# Patient Record
Sex: Female | Born: 2000
Health system: Southern US, Community
[De-identification: ages and names within clinical notes are randomized; demographics above are authoritative.]

---
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
## 2021-03-06 ENCOUNTER — Ambulatory Visit (INDEPENDENT_AMBULATORY_CARE_PROVIDER_SITE_OTHER): Payer: BC Managed Care – PPO | Admitting: Nurse Practitioner

## 2021-03-06 ENCOUNTER — Encounter: Payer: Self-pay | Admitting: Nurse Practitioner

## 2021-03-06 ENCOUNTER — Other Ambulatory Visit: Payer: Self-pay

## 2021-03-06 VITALS — BP 122/72 | HR 51 | Temp 97.6°F | Ht 68.5 in | Wt 178.0 lb

## 2021-03-06 DIAGNOSIS — Z6826 Body mass index (BMI) 26.0-26.9, adult: Secondary | ICD-10-CM | POA: Diagnosis not present

## 2021-03-06 DIAGNOSIS — Z0000 Encounter for general adult medical examination without abnormal findings: Secondary | ICD-10-CM

## 2021-03-06 NOTE — Patient Instructions (Addendum)
We will call you with lab results and when rabies vaccines arrive Follow-up 1-year    Health Maintenance, Female Adopting a healthy lifestyle and getting preventive care are important in promoting health and wellness. Ask your health care provider about: The right schedule for you to have regular tests and exams. Things you can do on your own to prevent diseases and keep yourself healthy. What should I know about diet, weight, and exercise? Eat a healthy diet  Eat a diet that includes plenty of vegetables, fruits, low-fat dairy products, and lean protein. Do not eat a lot of foods that are high in solid fats, added sugars, or sodium.  Maintain a healthy weight Body mass index (BMI) is used to identify weight problems. It estimates body fat based on height and weight. Your health care provider can help determineyour BMI and help you achieve or maintain a healthy weight. Get regular exercise Get regular exercise. This is one of the most important things you can do for your health. Most adults should: Exercise for at least 150 minutes each week. The exercise should increase your heart rate and make you sweat (moderate-intensity exercise). Do strengthening exercises at least twice a week. This is in addition to the moderate-intensity exercise. Spend less time sitting. Even light physical activity can be beneficial. Watch cholesterol and blood lipids Have your blood tested for lipids and cholesterol at 19 years of age, then havethis test every 5 years. Have your cholesterol levels checked more often if: Your lipid or cholesterol levels are high. You are older than 19 years of age. You are at high risk for heart disease. What should I know about cancer screening? Depending on your health history and family history, you may need to have cancer screening at various ages. This may include screening for: Breast cancer. Cervical cancer. Colorectal cancer. Skin cancer. Lung cancer. What should  I know about heart disease, diabetes, and high blood pressure? Blood pressure and heart disease High blood pressure causes heart disease and increases the risk of stroke. This is more likely to develop in people who have high blood pressure readings, are of African descent, or are overweight. Have your blood pressure checked: Every 3-5 years if you are 19-65 years of age. Every year if you are 19 years old or older. Diabetes Have regular diabetes screenings. This checks your fasting blood sugar level. Have the screening done: Once every three years after age 19 if you are at a normal weight and have a low risk for diabetes. More often and at a younger age if you are overweight or have a high risk for diabetes. What should I know about preventing infection? Hepatitis B If you have a higher risk for hepatitis B, you should be screened for this virus. Talk with your health care provider to find out if you are at risk forhepatitis B infection. Hepatitis C Testing is recommended for: Everyone born from [birth year] through 1965. Anyone with known risk factors for hepatitis C. Sexually transmitted infections (STIs) Get screened for STIs, including gonorrhea and chlamydia, if: You are sexually active and are younger than 19 years of age. You are older than 19 years of age and your health care provider tells you that you are at risk for this type of infection. Your sexual activity has changed since you were last screened, and you are at increased risk for chlamydia or gonorrhea. Ask your health care provider if you are at risk. Ask your health care provider about whether you  are at high risk for HIV. Your health care provider may recommend a prescription medicine to help prevent HIV infection. If you choose to take medicine to prevent HIV, you should first get tested for HIV. You should then be tested every 3 months for as long as you are taking the medicine. Pregnancy If you are about to stop having your  period (premenopausal) and you may become pregnant, seek counseling before you get pregnant. Take 400 to 800 micrograms (mcg) of folic acid every day if you become pregnant. Ask for birth control (contraception) if you want to prevent pregnancy. Osteoporosis and menopause Osteoporosis is a disease in which the bones lose minerals and strength with aging. This can result in bone fractures. If you are 16 years old or older, or if you are at risk for osteoporosis and fractures, ask your health care provider if you should: Be screened for bone loss. Take a calcium or vitamin D supplement to lower your risk of fractures. Be given hormone replacement therapy (HRT) to treat symptoms of menopause. Follow these instructions at home: Lifestyle Do not use any products that contain nicotine or tobacco, such as cigarettes, e-cigarettes, and chewing tobacco. If you need help quitting, ask your health care provider. Do not use street drugs. Do not share needles. Ask your health care provider for help if you need support or information about quitting drugs. Alcohol use Do not drink alcohol if: Your health care provider tells you not to drink. You are pregnant, may be pregnant, or are planning to become pregnant. If you drink alcohol: Limit how much you use to 0-1 drink a day. Limit intake if you are breastfeeding. Be aware of how much alcohol is in your drink. In the [place] one drink equals one 12 oz bottle of beer (355 mL), one 5 oz glass of wine (148 mL), or one 1 oz glass of hard liquor (44 mL). General instructions Schedule regular health, dental, and eye exams. Stay current with your vaccines. Tell your health care provider if: You often feel depressed. You have ever been abused or do not feel safe at home. Summary Adopting a healthy lifestyle and getting preventive care are important in promoting health and wellness. Follow your health care provider's instructions about healthy diet, exercising,  and getting tested or screened for diseases. Follow your health care provider's instructions on monitoring your cholesterol and blood pressure. This information is not intended to replace advice given to you by your health care provider. Make sure you discuss any questions you have with your healthcare provider. Document Revised: 08/03/2018 Document Reviewed: 08/03/2018 Elsevier Patient Education  2022 [employer].

## 2021-03-06 NOTE — Progress Notes (Signed)
New Patient Office Visit  Subjective:  Patient ID: Laura Luna, female    DOB: 28-Aug-2000  Age: 20 y.o. MRN: [id number]  CC:  Chief Complaint  Patient presents with   Annual Exam    College physical    HPI Laura Luna is an 20 year old Caucasian female that presents for college physical and to establish a primary care provider. This is her initial visit to the office. 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 She has a past medical history of chronic allergic rhinitis that are worse in the spring. She exercises regularly and is an avid Ship broker. She consumes a heart healthy diet. She wears contact lenses/prescription glasses, sees eye doctor yearly. Laura Luna is up-to-date on routine dental exams.   History reviewed. No pertinent past medical history.  History reviewed. No pertinent surgical history.  Family History  Problem Relation Age of Onset   Diabetes Father     Social History   Socioeconomic History   Marital status: Single    Spouse name: Not on file   Number of children: Not on file   Years of education: Not on file   Highest education level: Not on file  Occupational History   Not on file  Tobacco Use   Smoking status: Never    Passive exposure: Never   Smokeless tobacco: Never  Vaping Use   Vaping Use: Never used  Substance and Sexual Activity   Alcohol use: Never   Drug use: Never   Sexual activity: Not on file  Other Topics Concern   Not on file  Social History Narrative   Not on file   Social Determinants of Health   Financial Resource Strain: Not on file  Food Insecurity: Not on file  Transportation Needs: Not on file  Physical Activity: Not on file  Stress: Not on file  Social Connections: Not on file  Intimate Partner Violence: Not on file    ROS Review of Systems  Constitutional:  Negative for appetite change, fatigue and fever.   HENT:  Negative for congestion, ear pain, sinus pressure and sore throat.   Eyes:  Negative for pain.  Respiratory:  Negative for cough, chest tightness, shortness of breath and wheezing.   Cardiovascular:  Negative for chest pain and palpitations.  Gastrointestinal:  Negative for abdominal pain, constipation, diarrhea, nausea and vomiting.  Genitourinary:  Negative for dysuria and hematuria.  Musculoskeletal:  Negative for arthralgias, back pain, joint swelling and myalgias.  Skin:  Negative for rash.  Neurological:  Negative for dizziness, weakness and headaches.  Psychiatric/Behavioral:  Negative for dysphoric mood. The patient is not nervous/anxious.    Objective:   Today's Vitals: BP 122/72 (BP Location: Left Arm, Patient Position: Sitting)   Pulse (!) 51   Temp 97.6 F (36.4 C) (Temporal)   Ht 5' 8.5" (1.74 m)   Wt 178 lb (80.7 kg)   SpO2 99%   BMI 26.67 kg/m   Physical Exam Vitals reviewed.  Constitutional:      Appearance: Normal appearance. She is normal weight.  HENT:     Right Ear: Tympanic membrane, ear canal and external ear normal.     Left Ear: Tympanic membrane, ear canal and external ear normal.     Nose: Nose normal.     Mouth/Throat:     Mouth: Mucous membranes are moist.  Cardiovascular:     Rate and Rhythm: Normal rate and  regular rhythm.     Pulses: Normal pulses.     Heart sounds: Normal heart sounds.  Pulmonary:     Effort: Pulmonary effort is normal.     Breath sounds: Normal breath sounds.  Abdominal:     Palpations: Abdomen is soft.  Musculoskeletal:        General: Normal range of motion.     Cervical back: Normal range of motion.  Skin:    General: Skin is warm and dry.  Neurological:     Mental Status: She is alert and oriented to person, place, and time.  Psychiatric:        Mood and Affect: Mood normal.        Behavior: Behavior normal.        Thought Content: Thought content normal.        Judgment: Judgment normal.     Assessment & Plan:   1. Encounter for general adult medical examination without abnormal findings - QuantiFERON-TB Gold Plus - CBC with Differential/Platelet - Comprehensive metabolic panel - TSH -Laura Luna will return for rabies vaccine series and seasonal flu shot when they are available  2. BMI 26.0-26.9,adult  3. Encounter for medical examination to establish care   We will call you with lab results and when rabies vaccines arrive Follow-up 1-year   Outpatient Encounter Medications as of 03/06/2021  Medication Sig   EPINEPHrine 0.3 mg/0.3 mL IJ SOAJ injection Use as directed for life threatening allergic reaction. May use Mylan generic   No facility-administered encounter medications on file as of 03/06/2021.    Follow-up: Return in about 1 year (around 03/06/2022).     I,Lauren M Auman,acting as a Neurosurgeon for [employer], NP.,have documented all relevant documentation on the behalf of Janie Morning, NP,as directed by  Janie Morning, NP while in the presence of Janie Morning, NP.   I, Janie Morning, NP, have reviewed all documentation for this visit. The documentation on 03/06/21 for the exam, diagnosis, procedures, and orders are all accurate and complete.   Signed, Flonnie Hailstone, DNP

## 2021-03-11 LAB — COMPREHENSIVE METABOLIC PANEL
ALT: 17 IU/L (ref 0–32)
AST: 19 IU/L (ref 0–40)
Albumin/Globulin Ratio: 1.8 (ref 1.2–2.2)
Albumin: 4.6 g/dL (ref 3.9–5.0)
Alkaline Phosphatase: 68 IU/L (ref 42–106)
BUN/Creatinine Ratio: 22 (ref 9–23)
BUN: 21 mg/dL — ABNORMAL HIGH (ref 6–20)
Bilirubin Total: 1.2 mg/dL (ref 0.0–1.2)
CO2: 23 mmol/L (ref 20–29)
Calcium: 9.8 mg/dL (ref 8.7–10.2)
Chloride: 99 mmol/L (ref 96–106)
Creatinine, Ser: 0.95 mg/dL (ref 0.57–1.00)
Globulin, Total: 2.5 g/dL (ref 1.5–4.5)
Glucose: 79 mg/dL (ref 65–99)
Potassium: 4.5 mmol/L (ref 3.5–5.2)
Sodium: 135 mmol/L (ref 134–144)
Total Protein: 7.1 g/dL (ref 6.0–8.5)
eGFR: 89 mL/min/{1.73_m2} (ref >59)

## 2021-03-11 LAB — CBC WITH DIFFERENTIAL/PLATELET
Basophils Absolute: 0.1 10*3/uL (ref 0.0–0.2)
Basos: 1 %
EOS (ABSOLUTE): 0.1 10*3/uL (ref 0.0–0.4)
Eos: 1 %
Hematocrit: 41.1 % (ref 34.0–46.6)
Hemoglobin: 13.4 g/dL (ref 11.1–15.9)
Immature Grans (Abs): 0 10*3/uL (ref 0.0–0.1)
Immature Granulocytes: 0 %
Lymphocytes Absolute: 1.5 10*3/uL (ref 0.7–3.1)
Lymphs: 15 %
MCH: 29.2 pg (ref 26.6–33.0)
MCHC: 32.6 g/dL (ref 31.5–35.7)
MCV: 90 fL (ref 79–97)
Monocytes Absolute: 0.5 10*3/uL (ref 0.1–0.9)
Monocytes: 5 %
Neutrophils Absolute: 8 10*3/uL — ABNORMAL HIGH (ref 1.4–7.0)
Neutrophils: 78 %
Platelets: 267 10*3/uL (ref 150–450)
RBC: 4.59 x10E6/uL (ref 3.77–5.28)
RDW: 11.9 % (ref 11.7–15.4)
WBC: 10.2 10*3/uL (ref 3.4–10.8)

## 2021-03-11 LAB — QUANTIFERON-TB GOLD PLUS
QuantiFERON Mitogen Value: >10 IU/mL
QuantiFERON Nil Value: 0.02 IU/mL
QuantiFERON TB1 Ag Value: 0.03 IU/mL
QuantiFERON TB2 Ag Value: 0.03 IU/mL
QuantiFERON-TB Gold Plus: NEGATIVE

## 2021-03-11 LAB — TSH: TSH: 2.32 u[IU]/mL (ref 0.450–4.500)

## 2021-03-26 ENCOUNTER — Encounter: Payer: Self-pay | Admitting: Nurse Practitioner

## 2021-03-31 ENCOUNTER — Ambulatory Visit (INDEPENDENT_AMBULATORY_CARE_PROVIDER_SITE_OTHER): Payer: BC Managed Care – PPO

## 2021-03-31 ENCOUNTER — Other Ambulatory Visit: Payer: Self-pay

## 2021-03-31 DIAGNOSIS — Z23 Encounter for immunization: Secondary | ICD-10-CM

## 2021-04-08 ENCOUNTER — Other Ambulatory Visit: Payer: Self-pay

## 2021-04-08 ENCOUNTER — Ambulatory Visit (INDEPENDENT_AMBULATORY_CARE_PROVIDER_SITE_OTHER): Payer: BC Managed Care – PPO

## 2021-04-08 DIAGNOSIS — Z23 Encounter for immunization: Secondary | ICD-10-CM

## 2021-04-30 ENCOUNTER — Encounter: Payer: Self-pay | Admitting: Nurse Practitioner

## 2021-05-01 ENCOUNTER — Ambulatory Visit: Payer: BC Managed Care – PPO

## 2021-05-14 DIAGNOSIS — M542 Cervicalgia: Secondary | ICD-10-CM | POA: Diagnosis not present

## 2021-05-14 DIAGNOSIS — M9903 Segmental and somatic dysfunction of lumbar region: Secondary | ICD-10-CM | POA: Diagnosis not present

## 2021-05-14 DIAGNOSIS — M9902 Segmental and somatic dysfunction of thoracic region: Secondary | ICD-10-CM | POA: Diagnosis not present

## 2021-05-14 DIAGNOSIS — M9901 Segmental and somatic dysfunction of cervical region: Secondary | ICD-10-CM | POA: Diagnosis not present

## 2021-05-19 DIAGNOSIS — M9903 Segmental and somatic dysfunction of lumbar region: Secondary | ICD-10-CM | POA: Diagnosis not present

## 2021-05-19 DIAGNOSIS — M542 Cervicalgia: Secondary | ICD-10-CM | POA: Diagnosis not present

## 2021-05-19 DIAGNOSIS — M9902 Segmental and somatic dysfunction of thoracic region: Secondary | ICD-10-CM | POA: Diagnosis not present

## 2021-05-19 DIAGNOSIS — M9901 Segmental and somatic dysfunction of cervical region: Secondary | ICD-10-CM | POA: Diagnosis not present

## 2021-09-19 DIAGNOSIS — L700 Acne vulgaris: Secondary | ICD-10-CM | POA: Diagnosis not present

## 2021-11-14 DIAGNOSIS — L700 Acne vulgaris: Secondary | ICD-10-CM | POA: Diagnosis not present

## 2021-12-09 DIAGNOSIS — Z20828 Contact with and (suspected) exposure to other viral communicable diseases: Secondary | ICD-10-CM | POA: Diagnosis not present

## 2021-12-09 DIAGNOSIS — R051 Acute cough: Secondary | ICD-10-CM | POA: Diagnosis not present

## 2021-12-09 DIAGNOSIS — M791 Myalgia, unspecified site: Secondary | ICD-10-CM | POA: Diagnosis not present

## 2021-12-09 DIAGNOSIS — J209 Acute bronchitis, unspecified: Secondary | ICD-10-CM | POA: Diagnosis not present

## 2021-12-09 DIAGNOSIS — J0100 Acute maxillary sinusitis, unspecified: Secondary | ICD-10-CM | POA: Diagnosis not present

## 2021-12-09 DIAGNOSIS — R0981 Nasal congestion: Secondary | ICD-10-CM | POA: Diagnosis not present

## 2022-01-19 DIAGNOSIS — N926 Irregular menstruation, unspecified: Secondary | ICD-10-CM | POA: Insufficient documentation

## 2022-02-13 DIAGNOSIS — L700 Acne vulgaris: Secondary | ICD-10-CM | POA: Diagnosis not present

## 2022-02-14 DIAGNOSIS — R112 Nausea with vomiting, unspecified: Secondary | ICD-10-CM | POA: Diagnosis not present

## 2022-02-14 DIAGNOSIS — H81399 Other peripheral vertigo, unspecified ear: Secondary | ICD-10-CM | POA: Diagnosis not present

## 2022-02-19 ENCOUNTER — Encounter: Payer: BC Managed Care – PPO | Admitting: Nurse Practitioner

## 2022-02-19 NOTE — Progress Notes (Signed)
Subjective:  Patient ID: Laura Luna, female    DOB: [birth year]  Age: 21 y.o. MRN: [id number]  Chief Complaint  Patient presents with   Annual Exam    HPI Encounter for general adult medical examination without abnormal findings  Physical ("At Risk" items are starred): Patient's last physical exam was 1 year ago .      SDOH Screenings   Alcohol Screen: Low Risk  (02/20/2022)   Alcohol Screen    Last Alcohol Screening Score (AUDIT): 0  Depression (PHQ2-9): Low Risk  (02/20/2022)   Depression (PHQ2-9)    PHQ-2 Score: 0  Financial Resource Strain: Low Risk  (02/20/2022)   Overall Financial Resource Strain (CARDIA)    Difficulty of Paying Living Expenses: Not hard at all  Food Insecurity: No Food Insecurity (02/20/2022)   Hunger Vital Sign    Worried About Running Out of Food in the Last Year: Never true    Ran Out of Food in the Last Year: Never true  Housing: Low Risk  (02/20/2022)   Housing    Last Housing Risk Score: 0  Physical Activity: Sufficiently Active (02/20/2022)   Exercise Vital Sign    Days of Exercise per Week: 5 days    Minutes of Exercise per Session: 60 min  Social Connections: Moderately Isolated (02/20/2022)   Social Connection and Isolation Panel [NHANES]    Frequency of Communication with Friends and Family: More than three times a week    Frequency of Social Gatherings with Friends and Family: More than three times a week    Attends Religious Services: More than 4 times per year    Active Member of Clubs or Organizations: No    Attends Banker Meetings: Never    Marital Status: Never married  Stress: No Stress Concern Present (02/20/2022)   [employer] of Occupational Health - Occupational Stress Questionnaire    Feeling of Stress : Not at all  Tobacco Use: Low Risk  (03/06/2021)   Patient History    Smoking Tobacco Use: Never    Smokeless Tobacco Use: Never    Passive Exposure: Never  Transportation Needs: No Transportation Needs  (02/20/2022)   PRAPARE - Transportation    Lack of Transportation (Medical): No    Lack of Transportation (Non-Medical): No       02/20/2022   11:12 AM 03/06/2021   10:16 AM  Fall Risk   Falls in the past year? 0 0  Number falls in past yr: 0 0  Injury with Fall? 0 0  Risk for fall due to : No Fall Risks   Follow up Falls evaluation completed Falls evaluation completed       02/20/2022   11:13 AM 03/06/2021   10:15 AM  Depression screen PHQ 2/9  Decreased Interest 0 0  Down, Depressed, Hopeless 0 0  PHQ - 2 Score 0 0    Functional Status Survey: Is the patient deaf or have difficulty hearing?: No Does the patient have difficulty seeing, even when wearing glasses/contacts?: No Does the patient have difficulty concentrating, remembering, or making decisions?: No Does the patient have difficulty walking or climbing stairs?: No Does the patient have difficulty dressing or bathing?: No Does the patient have difficulty doing errands alone such as visiting a doctor's office or shopping?: No   Safety: reviewed ;  Patient wears a seat belt. Patient's home has smoke detectors and carbon monoxide detectors. Patient practices appropriate gun safety Patient wears sunscreen with extended sun exposure. Dental  Care: biannual cleanings, brushes and flosses daily. Ophthalmology/Optometry: Overdue Hearing loss: none Vision impairments: wears contact lenses Patient is not afflicted from Stress Incontinence and Urge Incontinence   Current Outpatient Medications on File Prior to Visit  Medication Sig Dispense Refill   EPINEPHrine 0.3 mg/0.3 mL IJ SOAJ injection Use as directed for life threatening allergic reaction. May use Mylan generic     No current facility-administered medications on file prior to visit.    Social Hx   Social History   Socioeconomic History   Marital status: Single    Spouse name: Not on file   Number of children: Not on file   Years of education: Not on file    Highest education level: Not on file  Occupational History   Not on file  Tobacco Use   Smoking status: Never    Passive exposure: Never   Smokeless tobacco: Never  Vaping Use   Vaping Use: Never used  Substance and Sexual Activity   Alcohol use: Never   Drug use: Never   Sexual activity: Never  Other Topics Concern   Not on file  Social History Narrative   Not on file   Social Determinants of Health   Financial Resource Strain: Low Risk  (02/20/2022)   Overall Financial Resource Strain (CARDIA)    Difficulty of Paying Living Expenses: Not hard at all  Food Insecurity: No Food Insecurity (02/20/2022)   Hunger Vital Sign    Worried About Running Out of Food in the Last Year: Never true    Ran Out of Food in the Last Year: Never true  Transportation Needs: No Transportation Needs (02/20/2022)   PRAPARE - Administrator, Civil Service (Medical): No    Lack of Transportation (Non-Medical): No  Physical Activity: Sufficiently Active (02/20/2022)   Exercise Vital Sign    Days of Exercise per Week: 5 days    Minutes of Exercise per Session: 60 min  Stress: No Stress Concern Present (02/20/2022)   [employer] of Occupational Health - Occupational Stress Questionnaire    Feeling of Stress : Not at all  Social Connections: Moderately Isolated (02/20/2022)   Social Connection and Isolation Panel [NHANES]    Frequency of Communication with Friends and Family: More than three times a week    Frequency of Social Gatherings with Friends and Family: More than three times a week    Attends Religious Services: More than 4 times per year    Active Member of [employer] or Organizations: No    Attends Banker Meetings: Never    Marital Status: Never married   History reviewed. No pertinent past medical history. Family History  Problem Relation Age of Onset   Diabetes Father     Review of Systems  Constitutional:  Negative for chills, fatigue and fever.  HENT:   Negative for congestion, ear pain, rhinorrhea and sore throat.   Respiratory:  Negative for cough and shortness of breath.   Cardiovascular:  Negative for chest pain.  Gastrointestinal:  Negative for abdominal pain, constipation, diarrhea, nausea and vomiting.  Genitourinary:  Positive for menstrual problem (menorrhagia). Negative for dysuria and urgency.  Musculoskeletal:  Negative for back pain and myalgias.  Neurological:  Negative for dizziness, weakness, light-headedness and headaches.  Hematological:  Bruises/bleeds easily.  Psychiatric/Behavioral:  Negative for dysphoric mood. The patient is not nervous/anxious.      Objective:  BP 114/64   Pulse 84   Temp (!) 97.3 F (36.3 C)  Ht 5\' 9"  (1.753 m)   Wt 175 lb (79.4 kg)   LMP 02/10/2022 Comment: States she has had menstrual cycle 4 times since the beginning of May.  SpO2 100%   BMI 25.84 kg/m       02/20/2022   11:11 AM 03/06/2021   10:17 AM  BP/Weight  Systolic BP  122  Diastolic BP  72  Wt. (Lbs) 175 178  BMI 25.84 kg/m2 26.67 kg/m2    Physical Exam Vitals reviewed.  Constitutional:      Appearance: Normal appearance.  HENT:     Head: Normocephalic.     Right Ear: Tympanic membrane normal.     Left Ear: Tympanic membrane normal.     Nose: Nose normal.     Mouth/Throat:     Mouth: Mucous membranes are moist.  Eyes:     Pupils: Pupils are equal, round, and reactive to light.  Cardiovascular:     Rate and Rhythm: Normal rate and regular rhythm.  Pulmonary:     Effort: Pulmonary effort is normal.     Breath sounds: Normal breath sounds.  Abdominal:     General: Bowel sounds are normal.     Palpations: Abdomen is soft.  Musculoskeletal:        General: Normal range of motion.     Cervical back: Neck supple.  Skin:    General: Skin is warm and dry.     Capillary Refill: Capillary refill takes less than 2 seconds.  Neurological:     General: No focal deficit present.     Mental Status: She is alert and  oriented to person, place, and time.  Psychiatric:        Mood and Affect: Mood normal.        Behavior: Behavior normal.     Lab Results  Component Value Date   WBC 10.2 03/06/2021   HGB 13.4 03/06/2021   HCT 41.1 03/06/2021   PLT 267 03/06/2021   GLUCOSE 79 03/06/2021   ALT 17 03/06/2021   AST 19 03/06/2021   NA 135 03/06/2021   K 4.5 03/06/2021   CL 99 03/06/2021   CREATININE 0.95 03/06/2021   BUN 21 (H) 03/06/2021   CO2 23 03/06/2021   TSH 2.320 03/06/2021      Assessment & Plan:  1. Routine general medical examination at a health care facility - CBC with Differential/Platelet - Comprehensive metabolic panel - TSH  2. BMI 25.0-25.9,adult - CBC with Differential/Platelet - Comprehensive metabolic panel - TSH      These are the goals we discussed:  Goals   Continue heart healthy diet and regular physical activity      This is a list of the screening recommended for you and due dates:  Health Maintenance  Topic Date Due   Flu Shot  03/24/2022   Tetanus Vaccine  04/15/2023   HPV Vaccine  Completed      AN INDIVIDUALIZED CARE PLAN: was established or reinforced today.   SELF MANAGEMENT: The patient and I together assessed ways to personally work towards obtaining the recommended goals  Support needs The patient and/or family needs were assessed and services were offered and not necessary at this time.    Follow-up: 1-year, cpe  Signed, 04/17/2023, DNP [facility] [phone]

## 2022-02-20 ENCOUNTER — Ambulatory Visit (INDEPENDENT_AMBULATORY_CARE_PROVIDER_SITE_OTHER): Payer: BC Managed Care – PPO | Admitting: Nurse Practitioner

## 2022-02-20 ENCOUNTER — Other Ambulatory Visit: Payer: Self-pay | Admitting: Nurse Practitioner

## 2022-02-20 ENCOUNTER — Encounter: Payer: Self-pay | Admitting: Nurse Practitioner

## 2022-02-20 VITALS — BP 114/64 | HR 84 | Temp 97.3°F | Ht 69.0 in | Wt 175.0 lb

## 2022-02-20 DIAGNOSIS — N921 Excessive and frequent menstruation with irregular cycle: Secondary | ICD-10-CM

## 2022-02-20 DIAGNOSIS — Z6825 Body mass index (BMI) 25.0-25.9, adult: Secondary | ICD-10-CM | POA: Diagnosis not present

## 2022-02-20 DIAGNOSIS — Z0000 Encounter for general adult medical examination without abnormal findings: Secondary | ICD-10-CM | POA: Diagnosis not present

## 2022-02-20 MED ORDER — NORETHINDRONE 0.35 MG PO TABS: 1.0000 | ORAL_TABLET | Freq: Every day | ORAL | 11 refills | Status: DC

## 2022-02-20 NOTE — Patient Instructions (Addendum)
Preventive Care 20-20 Years Old, Female Preventive care refers to lifestyle choices and visits with your health care provider that can promote health and wellness. At this stage in your life, you may start seeing a primary care physician instead of a pediatrician for your preventive care. Preventive care visits are also called wellness exams. What can I expect for my preventive care visit? Counseling During your preventive care visit, your health care provider may ask about your: Medical history, including: Past medical problems. Family medical history. Pregnancy history. Current health, including: Menstrual cycle. Method of birth control. Emotional well-being. Home life and relationship well-being. Sexual activity and sexual health. Lifestyle, including: Alcohol, nicotine or tobacco, and drug use. Access to firearms. Diet, exercise, and sleep habits. Sunscreen use. Motor vehicle safety. Physical exam Your health care provider may check your: Height and weight. These may be used to calculate your BMI (body mass index). BMI is a measurement that tells if you are at a healthy weight. Waist circumference. This measures the distance around your waistline. This measurement also tells if you are at a healthy weight and may help predict your risk of certain diseases, such as type 2 diabetes and high blood pressure. Heart rate and blood pressure. Body temperature. Skin for abnormal spots. Breasts. What immunizations do I need?  Vaccines are usually given at various ages, according to a schedule. Your health care provider will recommend vaccines for you based on your age, medical history, and lifestyle or other factors, such as travel or where you work. What tests do I need? Screening Your health care provider may recommend screening tests for certain conditions. This may include: Vision and hearing tests. Lipid and cholesterol levels. Pelvic exam and Pap test. Hepatitis B  test. Hepatitis C test. HIV (human immunodeficiency virus) test. STI (sexually transmitted infection) testing, if you are at risk. Tuberculosis skin test if you have symptoms. BRCA-related cancer screening. This may be done if you have a family history of breast, ovarian, tubal, or peritoneal cancers. Talk with your health care provider about your test results, treatment options, and if necessary, the need for more tests. Follow these instructions at home: Eating and drinking  Eat a healthy diet that includes fresh fruits and vegetables, whole grains, lean protein, and low-fat dairy products. Drink enough fluid to keep your urine pale yellow. Do not drink alcohol if: Your health care provider tells you not to drink. You are pregnant, may be pregnant, or are planning to become pregnant. You are under the legal drinking age. In the [place] the legal drinking age is 67. If you drink alcohol: Limit how much you have to 0-1 drink a day. Know how much alcohol is in your drink. In the [place] one drink equals one 12 oz bottle of beer (355 mL), one 5 oz glass of wine (148 mL), or one 1 oz glass of hard liquor (44 mL). Lifestyle Brush your teeth every morning and night with fluoride toothpaste. Floss one time each day. Exercise for at least 30 minutes 5 or more days of the week. Do not use any products that contain nicotine or tobacco. These products include cigarettes, chewing tobacco, and vaping devices, such as e-cigarettes. If you need help quitting, ask your health care provider. Do not use drugs. If you are sexually active, practice safe sex. Use a condom or other form of protection to prevent STIs. If you do not wish to become pregnant, use a form of birth control. If you plan to become pregnant,  see your health care provider for a prepregnancy visit. Find healthy ways to manage stress, such as: Meditation, yoga, or listening to music. Journaling. Talking to a trusted person. Spending time  with friends and family. Safety Always wear your seat belt while driving or riding in a vehicle. Do not drive: If you have been drinking alcohol. Do not ride with someone who has been drinking. When you are tired or distracted. While texting. If you have been using any mind-altering substances or drugs. Wear a helmet and other protective equipment during sports activities. If you have firearms in your house, make sure you follow all gun safety procedures. Seek help if you have been bullied, physically abused, or sexually abused. Use the internet responsibly to avoid dangers, such as online bullying and online sex predators. What's next? Go to your health care provider once a year for an annual wellness visit. Ask your health care provider how often you should have your eyes and teeth checked. Stay up to date on all vaccines. This information is not intended to replace advice given to you by your health care provider. Make sure you discuss any questions you have with your health care provider. Document Revised: 02/05/2021 Document Reviewed: 02/05/2021 Elsevier Patient Education  [place].

## 2022-02-21 LAB — COMPREHENSIVE METABOLIC PANEL
ALT: 28 IU/L (ref 0–32)
AST: 28 IU/L (ref 0–40)
Albumin/Globulin Ratio: 1.7 (ref 1.2–2.2)
Albumin: 4.3 g/dL (ref 3.9–5.0)
Alkaline Phosphatase: 76 IU/L (ref 42–106)
BUN/Creatinine Ratio: 20 (ref 9–23)
BUN: 20 mg/dL (ref 6–20)
Bilirubin Total: 0.7 mg/dL (ref 0.0–1.2)
CO2: 22 mmol/L (ref 20–29)
Calcium: 9.7 mg/dL (ref 8.7–10.2)
Chloride: 102 mmol/L (ref 96–106)
Creatinine, Ser: 0.98 mg/dL (ref 0.57–1.00)
Globulin, Total: 2.5 g/dL (ref 1.5–4.5)
Glucose: 89 mg/dL (ref 70–99)
Potassium: 4.3 mmol/L (ref 3.5–5.2)
Sodium: 137 mmol/L (ref 134–144)
Total Protein: 6.8 g/dL (ref 6.0–8.5)
eGFR: 85 mL/min/{1.73_m2} (ref >59)

## 2022-02-21 LAB — CBC WITH DIFFERENTIAL/PLATELET
Basophils Absolute: 0.1 10*3/uL (ref 0.0–0.2)
Basos: 1 %
EOS (ABSOLUTE): 0.2 10*3/uL (ref 0.0–0.4)
Eos: 2 %
Hematocrit: 41.3 % (ref 34.0–46.6)
Hemoglobin: 13 g/dL (ref 11.1–15.9)
Immature Grans (Abs): 0 10*3/uL (ref 0.0–0.1)
Immature Granulocytes: 0 %
Lymphocytes Absolute: 2 10*3/uL (ref 0.7–3.1)
Lymphs: 25 %
MCH: 28.4 pg (ref 26.6–33.0)
MCHC: 31.5 g/dL (ref 31.5–35.7)
MCV: 90 fL (ref 79–97)
Monocytes Absolute: 0.4 10*3/uL (ref 0.1–0.9)
Monocytes: 5 %
Neutrophils Absolute: 5.3 10*3/uL (ref 1.4–7.0)
Neutrophils: 67 %
Platelets: 260 10*3/uL (ref 150–450)
RBC: 4.57 x10E6/uL (ref 3.77–5.28)
RDW: 12 % (ref 11.7–15.4)
WBC: 7.9 10*3/uL (ref 3.4–10.8)

## 2022-02-21 LAB — TSH: TSH: 1.95 u[IU]/mL (ref 0.450–4.500)

## 2022-05-18 DIAGNOSIS — L700 Acne vulgaris: Secondary | ICD-10-CM | POA: Diagnosis not present

## 2022-06-12 DIAGNOSIS — F419 Anxiety disorder, unspecified: Secondary | ICD-10-CM | POA: Diagnosis not present

## 2022-06-29 DIAGNOSIS — F419 Anxiety disorder, unspecified: Secondary | ICD-10-CM | POA: Diagnosis not present

## 2022-07-13 DIAGNOSIS — F419 Anxiety disorder, unspecified: Secondary | ICD-10-CM | POA: Diagnosis not present

## 2022-07-27 DIAGNOSIS — F419 Anxiety disorder, unspecified: Secondary | ICD-10-CM | POA: Diagnosis not present

## 2022-08-10 DIAGNOSIS — F419 Anxiety disorder, unspecified: Secondary | ICD-10-CM | POA: Diagnosis not present

## 2022-08-14 DIAGNOSIS — L700 Acne vulgaris: Secondary | ICD-10-CM | POA: Diagnosis not present

## 2023-03-15 ENCOUNTER — Other Ambulatory Visit: Payer: Self-pay

## 2023-03-15 DIAGNOSIS — N921 Excessive and frequent menstruation with irregular cycle: Secondary | ICD-10-CM

## 2023-03-15 MED ORDER — NORETHINDRONE 0.35 MG PO TABS: 1.0000 | ORAL_TABLET | Freq: Every day | ORAL | 0 refills | Status: DC

## 2023-04-16 ENCOUNTER — Other Ambulatory Visit: Payer: Self-pay | Admitting: Family Medicine

## 2023-04-16 DIAGNOSIS — N921 Excessive and frequent menstruation with irregular cycle: Secondary | ICD-10-CM

## 2023-07-20 ENCOUNTER — Telehealth: Payer: Self-pay

## 2023-07-20 NOTE — Telephone Encounter (Signed)
This pt was a previous pt of Flonnie Hailstone, NP. However, Carollee Herter has pursued a different career opportunity and is no longer a provider at [facility]. This pt is an active pt with our office and should have been about to schedule with any of our providers at the practice. I have called and scheduled this pt an appointment but had was unable to speak with her.  I left a message stating that she was a previous pt of Flonnie Hailstone, NP. However, Carollee Herter has pursued a different career opportunity and is no longer a provider at [facility]. I have other providers in the practice that are taking on her patients. I asked that she give [place] a call back to see about getting this appointment scheduled.   Mychart message was sent to the pt.

## 2023-07-20 NOTE — Telephone Encounter (Signed)
Copied from CRM [id number]. Topic: Appointments - Appointment Scheduling >> Jul 19, 2023  4:30 PM Alona Bene A wrote: Patient/patient representative is calling to schedule an appointment. Unable to schedule appointment due to providers schedule not showing. System shows patient is not assigned to any providers. Please reach out to patient to schedule appointment for Annual Physical Exam.

## 2023-09-08 ENCOUNTER — Other Ambulatory Visit: Payer: Self-pay

## 2023-09-08 DIAGNOSIS — N921 Excessive and frequent menstruation with irregular cycle: Secondary | ICD-10-CM

## 2023-09-21 ENCOUNTER — Ambulatory Visit (INDEPENDENT_AMBULATORY_CARE_PROVIDER_SITE_OTHER): Payer: PRIVATE HEALTH INSURANCE

## 2023-09-21 VITALS — BP 102/60 | HR 60 | Temp 97.4°F | Resp 14 | Ht 69.0 in | Wt 170.0 lb

## 2023-09-21 DIAGNOSIS — Z01419 Encounter for gynecological examination (general) (routine) without abnormal findings: Secondary | ICD-10-CM | POA: Diagnosis not present

## 2023-09-21 DIAGNOSIS — F419 Anxiety disorder, unspecified: Secondary | ICD-10-CM | POA: Insufficient documentation

## 2023-09-21 DIAGNOSIS — Z124 Encounter for screening for malignant neoplasm of cervix: Secondary | ICD-10-CM | POA: Insufficient documentation

## 2023-09-21 DIAGNOSIS — R400 Somnolence: Secondary | ICD-10-CM | POA: Insufficient documentation

## 2023-09-21 DIAGNOSIS — R5383 Other fatigue: Secondary | ICD-10-CM | POA: Diagnosis not present

## 2023-09-21 MED ORDER — HYDROXYZINE PAMOATE 25 MG PO CAPS: 25.0000 mg | ORAL_CAPSULE | Freq: Every day | ORAL | 0 refills | Status: DC | PRN

## 2023-09-21 NOTE — Progress Notes (Signed)
Subjective:  Patient ID: Laura Luna, female    DOB: September 23, 2000  Age: 23 y.o. MRN: [id number]  Chief Complaint  Patient presents with   Annual Exam    HPI Well Adult Physical: Patient here for a comprehensive physical exam.The patient reports problems - Patient feels tired  Do you take any herbs or supplements that were not prescribed by a doctor? no Are you taking calcium supplements? no Are you taking aspirin daily? no  Encounter for general adult medical examination without abnormal findings  Physical ("At Risk" items are starred): Patient's last physical exam was 1 year ago .  Patient is not afflicted from Stress Incontinence and Urge Incontinence  Patient wears a seat belts Patient has smoke detectors and has carbon monoxide detectors. Patient practices appropriate gun safety. Patient wears sunscreen with extended sun exposure. Dental Care: biannual cleanings, brushes and flosses daily. Ophthalmology/Optometry: Annual visit.  Hearing loss: none Vision impairments: none  Menarche: 22 years old Menstrual History: regular LMP: 2024 Pregnancy history: G0P0 Safe at home: yes Self breast exams: no   The patient is a 23 year old female who presents for a wellness exam and to address constipation and anxiety. She is accompanied by her mother.  She experiences constipation regularly, typically having bowel movements every couple of days. Last week, she did not have a bowel movement for a full week and used magnesium citrate, which took a day to work. She maintains a normal appetite, drinks plenty of water, and describes her diet as healthy, including fruits and vegetables. She currently takes Colace for constipation.  She experiences anxiety on a daily basis, with symptoms occurring one to two days a week. Her anxiety is heightened by stress or relationship issues, and she often needs her mother's approval before sending texts. She has been seeing a therapist for over a year, which  has been helpful, but she sometimes struggles to control her anxiety. She does not take any medication for anxiety currently. There is a family history of bipolar disorder and anxiety.  She has been on Micronor birth control continuously since last May to regulate her menstrual cycle, which was previously occurring every two weeks. Since starting the pill, she has not had a menstrual period. She is not sexually active and has never been pregnant.  She falls asleep very quickly, often within five minutes of sitting down, and snores. She does not have trouble sleeping at night and wakes up feeling refreshed. She does not experience daytime tiredness unless she wakes up early to go to the gym, which she does three times a week for about an hour each session. She does not experience gasping for breath during sleep.  She has a sulfa allergy and carries an EpiPen as a precaution due to past allergy testing, although she has never experienced anaphylaxis. She does not smoke and drinks alcohol occasionally. 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      09/21/2023    9:06 AM 02/20/2022   11:13 AM 03/06/2021   10:15 AM  Depression screen PHQ 2/9  Decreased Interest 0 0 0  Down, Depressed, Hopeless 0 0 0  PHQ - 2 Score 0 0 0  Altered sleeping 2    Tired, decreased energy 1    Change in appetite 0    Feeling bad or failure about yourself  0    Trouble concentrating 0    Moving slowly or fidgety/restless 0    Suicidal thoughts  0    PHQ-9 Score 3    Difficult doing work/chores Not difficult at all           03/06/2021   10:16 AM 02/20/2022   11:12 AM 09/21/2023    9:06 AM  Fall Risk  Falls in the past year? 0 0 0  Was there an injury with Fall? 0 0 0  Fall Risk Category Calculator 0 0 0  Fall Risk Category (Retired) Low Low   (RETIRED) Patient Fall Risk Level Low fall risk Low fall risk   Patient at Risk for Falls Due to  No Fall Risks No Fall Risks  Fall risk Follow  up Falls evaluation completed Falls evaluation completed Falls evaluation completed             Social Hx   Social History   Socioeconomic History   Marital status: Single    Spouse name: Not on file   Number of children: Not on file   Years of education: Not on file   Highest education level: Not on file  Occupational History   Not on file  Tobacco Use   Smoking status: Never    Passive exposure: Never   Smokeless tobacco: Never  Vaping Use   Vaping status: Never Used  Substance and Sexual Activity   Alcohol use: Never   Drug use: Never   Sexual activity: Never  Other Topics Concern   Not on file  Social History Narrative   Not on file   Social Drivers of Health   Financial Resource Strain: Low Risk  (02/20/2022)   Overall Financial Resource Strain (CARDIA)    Difficulty of Paying Living Expenses: Not hard at all  Food Insecurity: No Food Insecurity (02/20/2022)   Hunger Vital Sign    Worried About Running Out of Food in the Last Year: Never true    Ran Out of Food in the Last Year: Never true  Transportation Needs: No Transportation Needs (02/20/2022)   PRAPARE - Administrator, Civil Service (Medical): No    Lack of Transportation (Non-Medical): No  Physical Activity: Sufficiently Active (02/20/2022)   Exercise Vital Sign    Days of Exercise per Week: 5 days    Minutes of Exercise per Session: 60 min  Stress: No Stress Concern Present (02/20/2022)   [employer] of Occupational Health - Occupational Stress Questionnaire    Feeling of Stress : Not at all  Social Connections: Moderately Isolated (02/20/2022)   Social Connection and Isolation Panel [NHANES]    Frequency of Communication with Friends and Family: More than three times a week    Frequency of Social Gatherings with Friends and Family: More than three times a week    Attends Religious Services: More than 4 times per year    Active Member of [employer] or Organizations: No    Attends Tax inspector Meetings: Never    Marital Status: Never married   History reviewed. No pertinent past medical history. Past Surgical History:  Procedure Laterality Date   ADENOIDECTOMY AND MYRINGOTOMY WITH TUBE PLACEMENT Bilateral 2006    Family History  Problem Relation Age of Onset   Diabetes Father     Review of Systems  Constitutional:  Positive for fatigue. Negative for chills and fever.  HENT:  Negative for congestion, ear pain and sore throat.   Respiratory:  Negative for cough and shortness of breath.   Cardiovascular:  Negative for chest pain and palpitations.  Gastrointestinal:  Negative for  abdominal pain, constipation, diarrhea, nausea and vomiting.  Endocrine: Negative for polydipsia, polyphagia and polyuria.  Genitourinary:  Negative for difficulty urinating and dysuria.  Musculoskeletal:  Negative for arthralgias, back pain and myalgias.  Skin:  Negative for rash.  Neurological:  Negative for headaches.  Psychiatric/Behavioral:  Positive for sleep disturbance (excessive sleepiness). Negative for dysphoric mood. The patient is nervous/anxious.      Objective:  BP 102/60   Pulse 60   Temp (!) 97.4 F (36.3 C)   Resp 14   Ht 5\' 9"  (1.753 m)   Wt 170 lb (77.1 kg)   LMP  (LMP Unknown)   BMI 25.10 kg/m      09/21/2023    8:53 AM 02/20/2022   11:11 AM 03/06/2021   10:17 AM  BP/Weight  Systolic BP 102 114 122  Diastolic BP 60 64 72  Wt. (Lbs) 170 175 178  BMI 25.1 kg/m2 25.84 kg/m2 26.67 kg/m2    Physical Exam Vitals and nursing note reviewed.  Constitutional:      Appearance: Normal appearance.  HENT:     Head: Normocephalic and atraumatic.     Mouth/Throat:     Mouth: Mucous membranes are moist.     Pharynx: Oropharynx is clear.  Eyes:     Extraocular Movements: Extraocular movements intact.     Pupils: Pupils are equal, round, and reactive to light.  Cardiovascular:     Rate and Rhythm: Normal rate and regular rhythm.  Pulmonary:     Effort:  Pulmonary effort is normal.     Breath sounds: Normal breath sounds.  Genitourinary:    Comments: PAP SMEAR PERFORMED Musculoskeletal:        General: Normal range of motion.  Skin:    General: Skin is warm and dry.  Neurological:     General: No focal deficit present.     Mental Status: She is alert and oriented to person, place, and time. Mental status is at baseline.  Psychiatric:        Mood and Affect: Mood normal.        Behavior: Behavior normal.     Lab Results  Component Value Date   WBC 7.9 02/20/2022   HGB 13.0 02/20/2022   HCT 41.3 02/20/2022   PLT 260 02/20/2022   GLUCOSE 89 02/20/2022   ALT 28 02/20/2022   AST 28 02/20/2022   NA 137 02/20/2022   K 4.3 02/20/2022   CL 102 02/20/2022   CREATININE 0.98 02/20/2022   BUN 20 02/20/2022   CO2 22 02/20/2022   TSH 1.950 02/20/2022      Assessment & Plan:  Well woman exam with routine gynecological exam Assessment & Plan: General Health Maintenance Routine wellness exam for a 23 year old. Discussed health maintenance screenings and vaccinations. Emphasized the importance of Pap smears starting at age 27 due to the risk of non-HPV related cervical cancer. - Perform Pap smear - Order blood work including CBC and thyroid function tests - Ensure up-to-date eye and dental exams - Discuss future mammograms starting at age 68 and colonoscopy at age 61 if no family history  Constipation Chronic constipation with a recent episode of no bowel movement for a week. Despite adequate hydration and a healthy diet, constipation persists. Current use of Colace may be insufficient. Discussed increasing water intake and using a daily fiber supplement like Metamucil or Benefiber to bulk stools and enhance bowel movements. - Increase water intake - Recommend daily fiber supplement such as Metamucil or Benefiber  Follow-up - Follow up on Pap smear results in one week - Review blood work results - Monitor effectiveness of  hydroxyzine for anxiety - Schedule follow-up appointment in one year unless new concerns arise.   Other fatigue -     Comprehensive metabolic panel -     T4, free -     TSH -     CBC -     Home sleep test; Future  Daytime somnolence Assessment & Plan: Falls asleep quickly, even during the day, and snores. No risk factors for obstructive sleep apnea. Central sleep apnea is a consideration. Discussed ordering a home sleep study as a screening test, with a potential follow-up titration study if positive. - Order home sleep study  Orders: -     Comprehensive metabolic panel -     T4, free -     TSH -     CBC -     Home sleep test; Future  Chronic anxiety Assessment & Plan: Intermittent anxiety with daily episodes. Ongoing therapy for over a year has been somewhat helpful. No current medication. Discussed as-needed medication like hydroxyzine, noting potential drowsiness and driving precautions. Considered daily SSRIs but noted a strong family history of bipolar disorder, complicating treatment. - Prescribe hydroxyzine 15 tablets for as-needed use, cautioning about drowsiness - Continue therapy sessions - Encourage coping strategies such as deep breathing   Encounter for Papanicolaou smear for cervical cancer screening Assessment & Plan: General Health Maintenance Routine wellness exam for a 23 year old. Discussed health maintenance screenings and vaccinations. Emphasized the importance of Pap smears starting at age 102 due to the risk of non-HPV related cervical cancer. - Perform Pap smear - Order blood work including CBC and thyroid function tests - Ensure up-to-date eye and dental exams - Discuss future mammograms starting at age 22 and colonoscopy at age 26 if no family history  Constipation Chronic constipation with a recent episode of no bowel movement for a week. Despite adequate hydration and a healthy diet, constipation persists. Current use of Colace may be insufficient.  Discussed increasing water intake and using a daily fiber supplement like Metamucil or Benefiber to bulk stools and enhance bowel movements. - Increase water intake - Recommend daily fiber supplement such as Metamucil or Benefiber  Follow-up - Follow up on Pap smear results in one week - Review blood work results - Monitor effectiveness of hydroxyzine for anxiety - Schedule follow-up appointment in one year unless new concerns arise.  Orders: -     IGP, Aptima HPV, rfx 16/18,45  Other orders -     hydrOXYzine Pamoate; Take 1 capsule (25 mg total) by mouth daily as needed for up to 15 days for anxiety.  Dispense: 15 capsule; Refill: 0     Body mass index is 25.1 kg/m.   These are the goals we discussed:  Goals   None      This is a list of the screening recommended for you and due dates:  Health Maintenance  Topic Date Due   Flu Shot  03/25/2023   DTaP/Tdap/Td vaccine (7 - Td or Tdap) 04/15/2023   COVID-19 Vaccine (1 - 2024-25 season) Never done   Pap Smear  Never done   HPV Vaccine  Completed   Pneumococcal Vaccination  Aged Out     Meds ordered this encounter  Medications   hydrOXYzine (VISTARIL) 25 MG capsule    Sig: Take 1 capsule (25 mg total) by mouth daily as needed for up to 15 days  for anxiety.    Dispense:  15 capsule    Refill:  0    Follow-up: Return in about 1 year (around 09/20/2024) for wellness exam.  An After Visit Summary was printed and given to the patient.  Windell Moment, MD [facility] [phone]

## 2023-09-21 NOTE — Assessment & Plan Note (Signed)
Intermittent anxiety with daily episodes. Ongoing therapy for over a year has been somewhat helpful. No current medication. Discussed as-needed medication like hydroxyzine, noting potential drowsiness and driving precautions. Considered daily SSRIs but noted a strong family history of bipolar disorder, complicating treatment. - Prescribe hydroxyzine 15 tablets for as-needed use, cautioning about drowsiness - Continue therapy sessions - Encourage coping strategies such as deep breathing

## 2023-09-21 NOTE — Assessment & Plan Note (Signed)
Falls asleep quickly, even during the day, and snores. No risk factors for obstructive sleep apnea. Central sleep apnea is a consideration. Discussed ordering a home sleep study as a screening test, with a potential follow-up titration study if positive. - Order home sleep study

## 2023-09-21 NOTE — Patient Instructions (Signed)
VISIT SUMMARY:  Today, we addressed your wellness exam and discussed your concerns about constipation and anxiety. We also reviewed your sleep patterns and general health maintenance.  YOUR PLAN:  -CONSTIPATION: Constipation means having infrequent or hard-to-pass bowel movements. We recommend increasing your water intake and starting a daily fiber supplement like Metamucil or Benefiber to help improve your bowel movements.  -ANXIETY: Anxiety is a feeling of worry or fear that can interfere with daily activities. We have prescribed hydroxyzine for as-needed use to help manage your anxiety symptoms. Please be cautious as it may cause drowsiness. Continue with your therapy sessions and practice coping strategies like deep breathing.  -SLEEP DISTURBANCE: Sleep disturbance includes issues like falling asleep too quickly and snoring. We will order a home sleep study to further investigate your sleep patterns and determine if there are any underlying issues.  -GENERAL HEALTH MAINTENANCE: We performed a routine wellness exam and discussed the importance of regular health screenings and vaccinations. We will perform a Pap smear today and have ordered blood work to check your overall health. Make sure to keep up with your eye and dental exams.  INSTRUCTIONS:  Please follow up on your Pap smear results in one week. We will also review your blood work results. Monitor the effectiveness of hydroxyzine for your anxiety and schedule a follow-up appointment in one year unless new concerns arise.

## 2023-09-21 NOTE — Assessment & Plan Note (Addendum)
General Health Maintenance Routine wellness exam for a 23 year old. Discussed health maintenance screenings and vaccinations. Emphasized the importance of Pap smears starting at age 32 due to the risk of non-HPV related cervical cancer. - Perform Pap smear - Order blood work including CBC and thyroid function tests - Ensure up-to-date eye and dental exams - Discuss future mammograms starting at age 45 and colonoscopy at age 73 if no family history  Constipation Chronic constipation with a recent episode of no bowel movement for a week. Despite adequate hydration and a healthy diet, constipation persists. Current use of Colace may be insufficient. Discussed increasing water intake and using a daily fiber supplement like Metamucil or Benefiber to bulk stools and enhance bowel movements. - Increase water intake - Recommend daily fiber supplement such as Metamucil or Benefiber  Follow-up - Follow up on Pap smear results in one week - Review blood work results - Monitor effectiveness of hydroxyzine for anxiety - Schedule follow-up appointment in one year unless new concerns arise.

## 2023-09-22 LAB — T4, FREE: Free T4: 1.3 ng/dL (ref 0.82–1.77)

## 2023-09-22 LAB — CBC
Hematocrit: 41.5 % (ref 34.0–46.6)
Hemoglobin: 13.8 g/dL (ref 11.1–15.9)
MCH: 30.4 pg (ref 26.6–33.0)
MCHC: 33.3 g/dL (ref 31.5–35.7)
MCV: 91 fL (ref 79–97)
Platelets: 240 10*3/uL (ref 150–450)
RBC: 4.54 x10E6/uL (ref 3.77–5.28)
RDW: 11.5 % — ABNORMAL LOW (ref 11.7–15.4)
WBC: 6.1 10*3/uL (ref 3.4–10.8)

## 2023-09-22 LAB — COMPREHENSIVE METABOLIC PANEL
ALT: 16 [IU]/L (ref 0–32)
AST: 17 [IU]/L (ref 0–40)
Albumin: 4.6 g/dL (ref 4.0–5.0)
Alkaline Phosphatase: 61 [IU]/L (ref 44–121)
BUN/Creatinine Ratio: 16 (ref 9–23)
BUN: 14 mg/dL (ref 6–20)
Bilirubin Total: 1.2 mg/dL (ref 0.0–1.2)
CO2: 24 mmol/L (ref 20–29)
Calcium: 9.9 mg/dL (ref 8.7–10.2)
Chloride: 103 mmol/L (ref 96–106)
Creatinine, Ser: 0.88 mg/dL (ref 0.57–1.00)
Globulin, Total: 2.5 g/dL (ref 1.5–4.5)
Glucose: 82 mg/dL (ref 70–99)
Potassium: 4.9 mmol/L (ref 3.5–5.2)
Sodium: 141 mmol/L (ref 134–144)
Total Protein: 7.1 g/dL (ref 6.0–8.5)
eGFR: 96 mL/min/{1.73_m2} (ref >59)

## 2023-09-22 LAB — TSH: TSH: 1.82 u[IU]/mL (ref 0.450–4.500)

## 2023-09-23 LAB — IGP, APTIMA HPV, RFX 16/18,45
HPV Aptima: NEGATIVE
PAP Smear Comment: 0

## 2023-09-28 ENCOUNTER — Other Ambulatory Visit: Payer: Self-pay

## 2024-02-15 ENCOUNTER — Ambulatory Visit (INDEPENDENT_AMBULATORY_CARE_PROVIDER_SITE_OTHER): Payer: PRIVATE HEALTH INSURANCE

## 2024-02-15 VITALS — BP 118/82 | HR 77 | Temp 97.6°F | Ht 69.0 in | Wt 175.4 lb

## 2024-02-15 DIAGNOSIS — F419 Anxiety disorder, unspecified: Secondary | ICD-10-CM

## 2024-02-15 DIAGNOSIS — N921 Excessive and frequent menstruation with irregular cycle: Secondary | ICD-10-CM | POA: Diagnosis not present

## 2024-02-15 MED ORDER — NORETHINDRONE 0.35 MG PO TABS: 1.0000 | ORAL_TABLET | Freq: Every day | ORAL | 1 refills | Status: DC

## 2024-02-15 MED ORDER — SERTRALINE HCL 25 MG PO TABS: 25.0000 mg | ORAL_TABLET | Freq: Every day | ORAL | 2 refills | Status: DC

## 2024-02-15 NOTE — Progress Notes (Signed)
 Subjective:  Patient ID: Laura Luna, female    DOB: Jan 03, 2001  Age: 23 y.o. MRN: [id number]  Chief Complaint  Patient presents with   Medical Management of Chronic Issues    HPI: Discussed the use of AI scribe software for clinical note transcription with the patient, who gave verbal consent to proceed.  History of Present Illness   Laura Luna is a 23 year old female who presents with anxiety and trouble sleeping.  Anxiety symptoms - Increased anxiety since January - Excessive worry and obsessive thoughts about minor issues - Anxiety described as 'high, strong' - No significant mood swings - No history of depression - Able to perform daily tasks and work without issue - Attends therapy sessions every two weeks - No prior use of daily medication for anxiety - Mother has suggested daily medication may be beneficial - Family history of bipolar disorder in mother and sister  Sleep disturbance - Difficulty sleeping since January - No difficulty falling asleep, but anxiety prevents 'shutting mind off' at night - Uses hydroxyzine  almost daily, primarily at night to aid sleep  Lifestyle and functioning - Exercises regularly with [employer] three to four days per week - Works as a Careers information officer with horses, cows, sheep, and goats - Finds work fulfilling         02/15/2024    3:39 PM 09/21/2023    9:06 AM 02/20/2022   11:13 AM 03/06/2021   10:15 AM  Depression screen PHQ 2/9  Decreased Interest 0 0 0 0  Down, Depressed, Hopeless 0 0 0 0  PHQ - 2 Score 0 0 0 0  Altered sleeping  2    Tired, decreased energy  1    Change in appetite  0    Feeling bad or failure about yourself   0    Trouble concentrating  0    Moving slowly or fidgety/restless  0    Suicidal thoughts  0    PHQ-9 Score  3    Difficult doing work/chores  Not difficult at all          02/15/2024    3:39 PM  Fall Risk   Falls in the past year? 0  Number falls in past yr: 0  Injury  with Fall? 0  Risk for fall due to : No Fall Risks    Patient Care Team: [place] Pettengill, MD as PCP - General (Family Medicine)   Review of Systems  Constitutional:  Negative for chills, fatigue and fever.  HENT:  Negative for congestion, ear pain and sore throat.   Respiratory:  Negative for cough and shortness of breath.   Cardiovascular:  Negative for chest pain.  Gastrointestinal:  Negative for abdominal pain, constipation, diarrhea, nausea and vomiting.  Genitourinary:  Negative for dysuria and frequency.  Musculoskeletal:  Negative for arthralgias and myalgias.  Neurological:  Negative for dizziness and headaches.  Psychiatric/Behavioral:  Negative for dysphoric mood. The patient is nervous/anxious.     Current Outpatient Medications on File Prior to Visit  Medication Sig Dispense Refill   docusate sodium (COLACE) 100 MG capsule Take 100 mg by mouth at bedtime.     EPINEPHrine 0.3 mg/0.3 mL IJ SOAJ injection Use as directed for life threatening allergic reaction. May use Mylan generic     hydrOXYzine  (VISTARIL ) 25 MG capsule Take 25 mg by mouth daily as needed.     No current facility-administered medications on file prior to visit.   History  reviewed. No pertinent past medical history. Past Surgical History:  Procedure Laterality Date   ADENOIDECTOMY AND MYRINGOTOMY WITH TUBE PLACEMENT Bilateral 2006    Family History  Problem Relation Age of Onset   Diabetes Father    Social History   Socioeconomic History   Marital status: Single    Spouse name: Not on file   Number of children: Not on file   Years of education: Not on file   Highest education level: Not on file  Occupational History   Not on file  Tobacco Use   Smoking status: Never    Passive exposure: Never   Smokeless tobacco: Never  Vaping Use   Vaping status: Never Used  Substance and Sexual Activity   Alcohol use: Never   Drug use: Never   Sexual activity: Never  Other Topics Concern   Not on  file  Social History Narrative   Not on file   Social Drivers of Health   Financial Resource Strain: Low Risk  (02/20/2022)   Overall Financial Resource Strain (CARDIA)    Difficulty of Paying Living Expenses: Not hard at all  Food Insecurity: No Food Insecurity (02/20/2022)   Hunger Vital Sign    Worried About Running Out of Food in the Last Year: Never true    Ran Out of Food in the Last Year: Never true  Transportation Needs: No Transportation Needs (02/20/2022)   PRAPARE - Administrator, Civil Service (Medical): No    Lack of Transportation (Non-Medical): No  Physical Activity: Sufficiently Active (02/20/2022)   Exercise Vital Sign    Days of Exercise per Week: 5 days    Minutes of Exercise per Session: 60 min  Stress: No Stress Concern Present (02/20/2022)   [employer] of Occupational Health - Occupational Stress Questionnaire    Feeling of Stress : Not at all  Social Connections: Moderately Isolated (02/20/2022)   Social Connection and Isolation Panel    Frequency of Communication with Friends and Family: More than three times a week    Frequency of Social Gatherings with Friends and Family: More than three times a week    Attends Religious Services: More than 4 times per year    Active Member of [employer] or Organizations: No    Attends Engineer, structural: Never    Marital Status: Never married    Objective:  BP 118/82   Pulse 77   Temp 97.6 F (36.4 C)   Ht 5' 9 (1.753 m)   Wt 175 lb 6.4 oz (79.6 kg)   SpO2 98%   BMI 25.90 kg/m      02/15/2024    3:37 PM 09/21/2023    8:53 AM 02/20/2022   11:11 AM  BP/Weight  Systolic BP 118 102 114  Diastolic BP 82 60 64  Wt. (Lbs) 175.4 170 175  BMI 25.9 kg/m2 25.1 kg/m2 25.84 kg/m2    Physical Exam Vitals and nursing note reviewed.  Constitutional:      Appearance: Normal appearance.  HENT:     Head: Normocephalic and atraumatic.   Cardiovascular:     Rate and Rhythm: Normal rate and  regular rhythm.  Pulmonary:     Effort: Pulmonary effort is normal.     Breath sounds: Normal breath sounds.   Neurological:     General: No focal deficit present.     Mental Status: She is alert.   Psychiatric:        Mood and Affect: Mood normal.  Lab Results  Component Value Date   WBC 6.1 09/21/2023   HGB 13.8 09/21/2023   HCT 41.5 09/21/2023   PLT 240 09/21/2023   GLUCOSE 82 09/21/2023   ALT 16 09/21/2023   AST 17 09/21/2023   NA 141 09/21/2023   K 4.9 09/21/2023   CL 103 09/21/2023   CREATININE 0.88 09/21/2023   BUN 14 09/21/2023   CO2 24 09/21/2023   TSH 1.820 09/21/2023      Assessment & Plan:  Chronic anxiety Assessment & Plan: She experiences persistent anxiety with obsessive worrying, exacerbated by recent stressors such as her grandmother's cancer diagnosis. She denies depression and bipolar disorder, despite a family history of bipolar disorder. Hydroxyzine  is used frequently for anxiety and sleep issues. SSRIs were discussed, with sertraline  (Zoloft ) recommended for its efficacy in treating obsessive-compulsive thoughts and anxiety, minimal weight gain effects, and safety in pregnancy. Sexual side effects are not a current concern as patient states she is not sexually active currently - Prescribe SERTRALINE  25 mg daily, with instructions to titrate the dose after 3-4 weeks if needed. - Continue hydroxyzine  as needed for anxiety and sleep issues. - Encourage therapy sessions every two weeks. - Advise regular exercise, specifically CrossFit, for anxiety management.    Follow-up Monitoring the effectiveness of sertraline  and adjusting the dosage as needed is crucial. - Schedule a follow-up appointment in three months to evaluate sertraline 's effectiveness.    Menorrhagia with irregular cycle Assessment & Plan: Refilled progesterone only birth control pill.   Orders: -     Norethindrone ; Take 1 tablet (0.35 mg total) by mouth daily.   Dispense: 84 tablet; Refill: 1  Other orders -     Sertraline  HCl; Take 1 tablet (25 mg total) by mouth daily.  Dispense: 30 tablet; Refill: 2   Assessment and Plan            Meds ordered this encounter  Medications   norethindrone  (MICRONOR ) 0.35 MG tablet    Sig: Take 1 tablet (0.35 mg total) by mouth daily.    Dispense:  84 tablet    Refill:  1   sertraline  (ZOLOFT ) 25 MG tablet    Sig: Take 1 tablet (25 mg total) by mouth daily.    Dispense:  30 tablet    Refill:  2    No orders of the defined types were placed in this encounter.    Follow-up: Return in about 3 months (around 05/17/2024) for chronic disease follow up.   I,Candice Gribble,acting as a Neurosurgeon for Emit Kuenzel, MD.,have documented all relevant documentation on the behalf of Vallory Oetken, MD,as directed by  Jurni Cesaro, MD while in the presence of Tommy Schimke, MD.   An After Visit Summary was printed and given to the patient.  Meridee Branum, MD [facility] [phone]

## 2024-02-18 NOTE — Assessment & Plan Note (Signed)
 Refilled progesterone only birth control pill.

## 2024-02-18 NOTE — Assessment & Plan Note (Addendum)
 She experiences persistent anxiety with obsessive worrying, exacerbated by recent stressors such as her grandmother's cancer diagnosis. She denies depression and bipolar disorder, despite a family history of bipolar disorder. Hydroxyzine  is used frequently for anxiety and sleep issues. SSRIs were discussed, with sertraline  (Zoloft ) recommended for its efficacy in treating obsessive-compulsive thoughts and anxiety, minimal weight gain effects, and safety in pregnancy. Sexual side effects are not a current concern as patient states she is not sexually active currently - Prescribe SERTRALINE  25 mg daily, with instructions to titrate the dose after 3-4 weeks if needed. - Continue hydroxyzine  as needed for anxiety and sleep issues. - Encourage therapy sessions every two weeks. - Advise regular exercise, specifically CrossFit, for anxiety management.    Follow-up Monitoring the effectiveness of sertraline  and adjusting the dosage as needed is crucial. - Schedule a follow-up appointment in three months to evaluate sertraline 's effectiveness.

## 2024-03-09 ENCOUNTER — Other Ambulatory Visit: Payer: Self-pay

## 2024-05-02 ENCOUNTER — Other Ambulatory Visit: Payer: Self-pay

## 2024-05-23 ENCOUNTER — Ambulatory Visit: Payer: PRIVATE HEALTH INSURANCE

## 2024-06-06 ENCOUNTER — Ambulatory Visit (INDEPENDENT_AMBULATORY_CARE_PROVIDER_SITE_OTHER): Payer: PRIVATE HEALTH INSURANCE

## 2024-06-06 VITALS — BP 110/60 | HR 50 | Temp 97.4°F | Resp 16 | Ht 69.0 in | Wt 181.0 lb

## 2024-06-06 DIAGNOSIS — N926 Irregular menstruation, unspecified: Secondary | ICD-10-CM

## 2024-06-06 DIAGNOSIS — F419 Anxiety disorder, unspecified: Secondary | ICD-10-CM | POA: Diagnosis not present

## 2024-06-06 DIAGNOSIS — Z23 Encounter for immunization: Secondary | ICD-10-CM

## 2024-06-06 MED ORDER — HYDROXYZINE PAMOATE 25 MG PO CAPS: 25.0000 mg | ORAL_CAPSULE | Freq: Every day | ORAL | 0 refills | Status: AC | PRN

## 2024-06-06 MED ORDER — SERTRALINE HCL 25 MG PO TABS: 25.0000 mg | ORAL_TABLET | Freq: Every day | ORAL | 0 refills | Status: DC

## 2024-06-06 NOTE — Assessment & Plan Note (Addendum)
 Experiencing irregular cycles on norethindrone  (Micronor ) for two years, with periods approximately once every two months. Initially prescribed due to frequent periods caused by stress. Not currently sexually active and considering discontinuation to see if cycles regulate naturally. Discussed switching to a combined birth control pill if irregularities persist after discontinuation. - Discontinue norethindrone  and monitor menstrual cycle - Document menstrual cycle patterns, including spotting and heavy days - Consider combined birth control pills if irregularities persist - Advise to send a message if Laura Luna wishes to restart birth control or switch to a combined pill    Acute upper respiratory infection Presents with symptoms primarily affecting the chest, likely contracted from a colleague. - Manage conservatively  Cortisol level inquiry Laura Luna Inquired about cortisol levels due to high-intensity exercise and potential link to anxiety. Discussed best time to check cortisol level is in the morning.  - Consider ordering AM cortisol level test if Laura Luna desires to investigate cortisol levels

## 2024-06-06 NOTE — Assessment & Plan Note (Addendum)
 Well-managed with Zoloft  25 mg. Reports significant improvement in symptoms, including reduced obsessive thoughts and improved sleep. Experiences emotions normally without flat affect. Primary concern is anxiety rather than depression. - Continue Zoloft  25 mg daily - Refill Zoloft  with a 90-day supply - Encourage continued exercise and mindfulness practices - Advise to send a message if there are any questions or if medication adjustments are needed

## 2024-06-06 NOTE — Progress Notes (Addendum)
 Subjective:  Patient ID: Laura Luna, female    DOB: 2001/06/05  Age: 23 y.o. MRN: [id number]  Chief Complaint  Patient presents with   Anxiety    HPI: Discussed the use of AI scribe software for clinical note transcription with the patient, who gave verbal consent to proceed.  Discussed the use of AI scribe software for clinical note transcription with the patient, who gave verbal consent to proceed.  History of Present Illness   Laura Luna is a 23 year old female who presents for follow-up regarding anxiety management and irregular menstrual cycles.  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)  Menstrual irregularity - Irregular menstrual cycles while on norethindrone  (Micronor ) for the past two years - Menses occur approximately once every two months - Initial indication for norethindrone  was frequent periods every two weeks during college, attributed to stress - Last menstrual period approximately two months ago - No migraines or other symptoms that would contraindicate combined oral contraceptives - Not currently sexually active  Exercise and physical activity - Engages in CrossFit five to six times per week - Attends a standard gym for lower intensity workouts - Expresses concern regarding potential impact of high intensity exercise on anxiety and inquires about cortisol level  testing            06/06/2024    3:27 PM 02/15/2024    3:39 PM 09/21/2023    9:06 AM 02/20/2022   11:13 AM 03/06/2021   10:15 AM  Depression screen PHQ 2/9  Decreased Interest 0 0 0 0 0  Down, Depressed, Hopeless 0 0 0 0 0  PHQ - 2 Score 0 0 0 0 0  Altered sleeping 0  2    Tired, decreased energy 0  1    Change in appetite 0  0    Feeling bad or failure about yourself  0  0    Trouble concentrating 0  0    Moving slowly or fidgety/restless 0  0    Suicidal thoughts 0  0    PHQ-9 Score 0  3    Difficult doing work/chores Not difficult at all  Not difficult at all          06/06/2024    3:26 PM  Fall Risk   Falls in the past year? 0  Number falls in past yr: 0  Injury with Fall? 0  Risk for fall due to : No Fall Risks  Follow up Falls evaluation completed    Patient Care Team: Lavon Horn, MD as PCP - General (Family Medicine)   Review of Systems  Constitutional:  Negative for chills, fatigue and fever.  HENT:  Negative for congestion, ear pain and sore throat.   Respiratory:  Positive for cough. Negative for shortness of breath and wheezing.   Cardiovascular:  Negative for chest pain and palpitations.  Gastrointestinal:  Negative for abdominal pain, constipation, diarrhea, nausea and vomiting.  Endocrine: Negative for polydipsia, polyphagia and polyuria.  Genitourinary:  Positive for menstrual problem. Negative for difficulty urinating and dysuria.  Musculoskeletal:  Negative for arthralgias, back pain and myalgias.  Skin:  Negative for rash.  Neurological:  Negative for headaches.  Psychiatric/Behavioral:  Negative for dysphoric mood and suicidal ideas. The patient is not nervous/anxious.     Current Outpatient Medications on File Prior to Visit  Medication Sig Dispense Refill   docusate sodium (COLACE) 100 MG capsule Take 100 mg by mouth at bedtime.     EPINEPHrine 0.3 mg/0.3 mL IJ SOAJ injection Use as directed for life threatening allergic reaction. May use  Mylan generic     norethindrone  (MICRONOR ) 0.35 MG tablet Take 1 tablet (0.35 mg total) by mouth daily. 84 tablet 1   No current facility-administered medications on file prior to visit.   History reviewed. No pertinent past medical history. Past Surgical History:  Procedure Laterality Date   ADENOIDECTOMY AND MYRINGOTOMY WITH TUBE PLACEMENT Bilateral 2006    Family History  Problem Relation Age of Onset   Diabetes Father    Social History   Socioeconomic History   Marital status: Single    Spouse name: Not on file   Number of children: Not on file   Years of education: Not on file   Highest education level: Not on file  Occupational History   Not on file  Tobacco Use   Smoking status: Never    Passive exposure: Never   Smokeless tobacco: Never  Vaping Use   Vaping status: Never Used  Substance and Sexual Activity   Alcohol use: Never   Drug use: Never   Sexual activity: Never  Other Topics Concern   Not on file  Social History Narrative   Not on file   Social Drivers of Health   Financial Resource Strain: Low Risk  (02/20/2022)   Overall Financial Resource Strain (CARDIA)    Difficulty of Paying Living Expenses: Not hard at all  Food Insecurity: No Food Insecurity (02/20/2022)   Hunger Vital Sign    Worried About Running Out of Food in the Last Year: Never true    Ran Out of Food in the Last Year: Never true  Transportation Needs: No Transportation Needs (02/20/2022)   PRAPARE - Administrator, Civil Service (Medical): No    Lack of Transportation (Non-Medical): No  Physical Activity: Sufficiently Active (02/20/2022)   Exercise Vital Sign    Days of Exercise per Week: 5 days    Minutes of Exercise per Session: 60 min  Stress: No Stress Concern Present (02/20/2022)   [employer] of Occupational Health - Occupational Stress Questionnaire    Feeling of Stress : Not at all  Social Connections: Moderately Isolated (02/20/2022)   Social Connection and  Isolation Panel    Frequency of Communication with Friends and Family: More than three times a week    Frequency of Social Gatherings with Friends and Family: More than three times a week    Attends Religious Services: More than 4 times per year    Active Member of [employer] or Organizations: No    Attends Engineer, structural: Never    Marital Status: Never married    Objective:  BP 110/60   Pulse (!) 50   Temp (!) 97.4 F (36.3 C)   Resp 16   Ht 5' 9 (1.753 m)   Wt 181 lb (82.1 kg)   LMP  (LMP Unknown)   SpO2 98%  BMI 26.73 kg/m      06/06/2024    3:07 PM 02/15/2024    3:37 PM 09/21/2023    8:53 AM  BP/Weight  Systolic BP 110 118 102  Diastolic BP 60 82 60  Wt. (Lbs) 181 175.4 170  BMI 26.73 kg/m2 25.9 kg/m2 25.1 kg/m2    Physical Exam Vitals and nursing note reviewed.  Constitutional:      Appearance: Normal appearance.  HENT:     Head: Normocephalic and atraumatic.  Cardiovascular:     Rate and Rhythm: Normal rate and regular rhythm.  Pulmonary:     Effort: Pulmonary effort is normal.     Breath sounds: Normal breath sounds.  Skin:    General: Skin is warm.  Neurological:     Mental Status: She is alert and oriented to person, place, and time.  Psychiatric:        Mood and Affect: Mood normal.         Lab Results  Component Value Date   WBC 6.1 09/21/2023   HGB 13.8 09/21/2023   HCT 41.5 09/21/2023   PLT 240 09/21/2023   GLUCOSE 82 09/21/2023   ALT 16 09/21/2023   AST 17 09/21/2023   NA 141 09/21/2023   K 4.9 09/21/2023   CL 103 09/21/2023   CREATININE 0.88 09/21/2023   BUN 14 09/21/2023   CO2 24 09/21/2023   TSH 1.820 09/21/2023    Results for orders placed or performed in visit on 09/21/23  Comprehensive metabolic panel   Collection Time: 09/21/23  9:42 AM  Result Value Ref Range   Glucose 82 70 - 99 mg/dL   BUN 14 6 - 20 mg/dL   Creatinine, Ser 9.11 0.57 - 1.00 mg/dL   eGFR 96 >40 fO/fpw/8.26   BUN/Creatinine Ratio 16 9 -  23   Sodium 141 134 - 144 mmol/L   Potassium 4.9 3.5 - 5.2 mmol/L   Chloride 103 96 - 106 mmol/L   CO2 24 20 - 29 mmol/L   Calcium 9.9 8.7 - 10.2 mg/dL   Total Protein 7.1 6.0 - 8.5 g/dL   Albumin 4.6 4.0 - 5.0 g/dL   Globulin, Total 2.5 1.5 - 4.5 g/dL   Bilirubin Total 1.2 0.0 - 1.2 mg/dL   Alkaline Phosphatase 61 44 - 121 IU/L   AST 17 0 - 40 IU/L   ALT 16 0 - 32 IU/L  T4, free   Collection Time: 09/21/23  9:42 AM  Result Value Ref Range   Free T4 1.30 0.82 - 1.77 ng/dL  TSH   Collection Time: 09/21/23  9:42 AM  Result Value Ref Range   TSH 1.820 0.450 - 4.500 uIU/mL  CBC   Collection Time: 09/21/23  9:42 AM  Result Value Ref Range   WBC 6.1 3.4 - 10.8 x10E3/uL   RBC 4.54 3.77 - 5.28 x10E6/uL   Hemoglobin 13.8 11.1 - 15.9 g/dL   Hematocrit 58.4 65.9 - 46.6 %   MCV 91 79 - 97 fL   MCH 30.4 26.6 - 33.0 pg   MCHC 33.3 31.5 - 35.7 g/dL   RDW 88.4 (L) 88.2 - 84.5 %   Platelets 240 150 - 450 x10E3/uL  IGP, Aptima HPV, rfx 16/18,45   Collection Time: 09/21/23  3:58 PM  Result Value Ref Range   DIAGNOSIS: Comment    Specimen adequacy: Comment    Clinician Provided ICD10 Comment    Performed by: Comment    PAP Smear Comment .  Note: Comment    Test Methodology Comment    HPV Aptima Negative Negative   HPV Genotype Reflex Comment   .  Assessment & Plan:   Assessment & Plan Chronic anxiety Well-managed with Zoloft  25 mg. Reports significant improvement in symptoms, including reduced obsessive thoughts and improved sleep. Experiences emotions normally without flat affect. Primary concern is anxiety rather than depression. - Continue Zoloft  25 mg daily - Refill Zoloft  with a 90-day supply - Encourage continued exercise and mindfulness practices - Advise to send a message if there are any questions or if medication adjustments are needed    Menstrual abnormality Experiencing irregular cycles on norethindrone  (Micronor ) for two years, with periods approximately once  every two months. Initially prescribed due to frequent periods caused by stress. Not currently sexually active and considering discontinuation to see if cycles regulate naturally. Discussed switching to a combined birth control pill if irregularities persist after discontinuation. - Discontinue norethindrone  and monitor menstrual cycle - Document menstrual cycle patterns, including spotting and heavy days - Consider combined birth control pills if irregularities persist - Advise to send a message if she wishes to restart birth control or switch to a combined pill    Acute upper respiratory infection Presents with symptoms primarily affecting the chest, likely contracted from a colleague. - Manage conservatively  Cortisol level inquiry She Inquired about cortisol levels due to high-intensity exercise and potential link to anxiety. Discussed best time to check cortisol level is in the morning.  - Consider ordering AM cortisol level test if she desires to investigate cortisol levels          Encounter for immunization She received her TDAP vaccine today Orders:   Tdap vaccine greater than or equal to 7yo IM    Body mass index is 26.73 kg/m.   Meds ordered this encounter  Medications   hydrOXYzine  (VISTARIL ) 25 MG capsule    Sig: Take 1 capsule (25 mg total) by mouth daily as needed.    Dispense:  60 capsule    Refill:  0   sertraline  (ZOLOFT ) 25 MG tablet    Sig: Take 1 tablet (25 mg total) by mouth daily.    Dispense:  90 tablet    Refill:  0    Orders Placed This Encounter  Procedures   Tdap vaccine greater than or equal to 7yo IM       Follow-up: Return in about 6 months (around 12/05/2024) for chronic disease follow up.  An After Visit Summary was printed and given to the patient.  Mikailah Morel, MD [facility] [phone]

## 2024-07-13 MED ORDER — SERTRALINE HCL 50 MG PO TABS: 50.0000 mg | ORAL_TABLET | Freq: Every day | ORAL | 1 refills | Status: AC

## 2024-07-22 ENCOUNTER — Telehealth: Payer: PRIVATE HEALTH INSURANCE | Admitting: Nurse Practitioner

## 2024-07-22 DIAGNOSIS — R309 Painful micturition, unspecified: Secondary | ICD-10-CM | POA: Diagnosis not present

## 2024-07-22 MED ORDER — NITROFURANTOIN MONOHYD MACRO 100 MG PO CAPS: 100.0000 mg | ORAL_CAPSULE | Freq: Two times a day (BID) | ORAL | 0 refills | Status: AC

## 2024-07-22 NOTE — Progress Notes (Signed)

## 2024-07-25 ENCOUNTER — Other Ambulatory Visit: Payer: Self-pay

## 2024-07-25 MED ORDER — NORETHINDRONE ACET-ETHINYL EST 1.5-30 MG-MCG PO TABS: 1.0000 | ORAL_TABLET | Freq: Every day | ORAL | 11 refills | Status: DC

## 2024-12-05 ENCOUNTER — Ambulatory Visit: Payer: PRIVATE HEALTH INSURANCE
